# Patient Record
Sex: Male | Born: 1945
Health system: Southern US, Community
[De-identification: ages and names within clinical notes are randomized; demographics above are authoritative.]

---

## 2003-06-29 ENCOUNTER — Ambulatory Visit (HOSPITAL_COMMUNITY): Admission: RE | Admit: 2003-06-29 | Discharge: 2003-06-29 | Payer: Self-pay | Admitting: Gastroenterology

## 2003-09-13 ENCOUNTER — Ambulatory Visit (HOSPITAL_COMMUNITY): Admission: RE | Admit: 2003-09-13 | Discharge: 2003-09-13 | Payer: Self-pay | Admitting: Gastroenterology

## 2015-01-11 DIAGNOSIS — E669 Obesity, unspecified: Secondary | ICD-10-CM | POA: Diagnosis present

## 2015-01-11 DIAGNOSIS — Z87891 Personal history of nicotine dependence: Secondary | ICD-10-CM | POA: Diagnosis not present

## 2015-01-11 DIAGNOSIS — L02415 Cutaneous abscess of right lower limb: Secondary | ICD-10-CM | POA: Diagnosis present

## 2015-01-11 DIAGNOSIS — Z8614 Personal history of Methicillin resistant Staphylococcus aureus infection: Secondary | ICD-10-CM | POA: Diagnosis not present

## 2015-01-11 DIAGNOSIS — L03115 Cellulitis of right lower limb: Secondary | ICD-10-CM | POA: Diagnosis present

## 2015-01-11 DIAGNOSIS — Z6834 Body mass index (BMI) 34.0-34.9, adult: Secondary | ICD-10-CM | POA: Diagnosis not present

## 2015-09-08 DIAGNOSIS — Z08 Encounter for follow-up examination after completed treatment for malignant neoplasm: Secondary | ICD-10-CM | POA: Diagnosis not present

## 2015-09-08 DIAGNOSIS — D225 Melanocytic nevi of trunk: Secondary | ICD-10-CM | POA: Diagnosis not present

## 2015-09-08 DIAGNOSIS — L821 Other seborrheic keratosis: Secondary | ICD-10-CM | POA: Diagnosis not present

## 2015-09-08 DIAGNOSIS — Z85828 Personal history of other malignant neoplasm of skin: Secondary | ICD-10-CM | POA: Diagnosis not present

## 2015-10-11 DIAGNOSIS — I8311 Varicose veins of right lower extremity with inflammation: Secondary | ICD-10-CM | POA: Diagnosis not present

## 2015-10-11 DIAGNOSIS — I83893 Varicose veins of bilateral lower extremities with other complications: Secondary | ICD-10-CM | POA: Diagnosis not present

## 2015-10-11 DIAGNOSIS — I8312 Varicose veins of left lower extremity with inflammation: Secondary | ICD-10-CM | POA: Diagnosis not present

## 2015-10-19 DIAGNOSIS — I83893 Varicose veins of bilateral lower extremities with other complications: Secondary | ICD-10-CM | POA: Diagnosis not present

## 2015-10-19 DIAGNOSIS — I8312 Varicose veins of left lower extremity with inflammation: Secondary | ICD-10-CM | POA: Diagnosis not present

## 2015-10-19 DIAGNOSIS — I8311 Varicose veins of right lower extremity with inflammation: Secondary | ICD-10-CM | POA: Diagnosis not present

## 2015-10-27 DIAGNOSIS — I83893 Varicose veins of bilateral lower extremities with other complications: Secondary | ICD-10-CM | POA: Diagnosis not present

## 2016-05-29 ENCOUNTER — Other Ambulatory Visit: Payer: Self-pay | Admitting: Family Medicine

## 2016-05-29 DIAGNOSIS — Z136 Encounter for screening for cardiovascular disorders: Secondary | ICD-10-CM

## 2016-06-19 ENCOUNTER — Ambulatory Visit
Admission: RE | Admit: 2016-06-19 | Discharge: 2016-06-19 | Disposition: A | Discharge status: Home / Self Care | Payer: Medicare Other | Source: Ambulatory Visit | Attending: Family Medicine | Admitting: Family Medicine

## 2016-06-19 DIAGNOSIS — Z136 Encounter for screening for cardiovascular disorders: Secondary | ICD-10-CM | POA: Diagnosis not present

## 2016-06-21 DIAGNOSIS — E7800 Pure hypercholesterolemia, unspecified: Secondary | ICD-10-CM | POA: Diagnosis not present

## 2016-06-21 DIAGNOSIS — I4510 Unspecified right bundle-branch block: Secondary | ICD-10-CM | POA: Diagnosis not present

## 2016-06-21 DIAGNOSIS — Z87891 Personal history of nicotine dependence: Secondary | ICD-10-CM | POA: Diagnosis not present

## 2016-06-21 DIAGNOSIS — I77811 Abdominal aortic ectasia: Secondary | ICD-10-CM | POA: Diagnosis not present

## 2016-08-24 DIAGNOSIS — I4510 Unspecified right bundle-branch block: Secondary | ICD-10-CM | POA: Diagnosis not present

## 2016-12-17 DIAGNOSIS — R109 Unspecified abdominal pain: Secondary | ICD-10-CM | POA: Diagnosis not present

## 2016-12-17 DIAGNOSIS — R350 Frequency of micturition: Secondary | ICD-10-CM | POA: Diagnosis not present

## 2017-06-13 DIAGNOSIS — Z23 Encounter for immunization: Secondary | ICD-10-CM | POA: Diagnosis not present

## 2017-10-11 DIAGNOSIS — K6289 Other specified diseases of anus and rectum: Secondary | ICD-10-CM | POA: Diagnosis not present

## 2017-10-11 DIAGNOSIS — D126 Benign neoplasm of colon, unspecified: Secondary | ICD-10-CM | POA: Diagnosis not present

## 2017-10-11 DIAGNOSIS — Z1211 Encounter for screening for malignant neoplasm of colon: Secondary | ICD-10-CM | POA: Diagnosis not present

## 2017-10-11 DIAGNOSIS — K5730 Diverticulosis of large intestine without perforation or abscess without bleeding: Secondary | ICD-10-CM | POA: Diagnosis not present

## 2017-10-11 DIAGNOSIS — K648 Other hemorrhoids: Secondary | ICD-10-CM | POA: Diagnosis not present

## 2017-10-11 DIAGNOSIS — Z800 Family history of malignant neoplasm of digestive organs: Secondary | ICD-10-CM | POA: Diagnosis not present

## 2017-10-15 DIAGNOSIS — Z1211 Encounter for screening for malignant neoplasm of colon: Secondary | ICD-10-CM | POA: Diagnosis not present

## 2017-10-15 DIAGNOSIS — D126 Benign neoplasm of colon, unspecified: Secondary | ICD-10-CM | POA: Diagnosis not present

## 2018-02-27 DIAGNOSIS — S70369A Insect bite (nonvenomous), unspecified thigh, initial encounter: Secondary | ICD-10-CM | POA: Diagnosis not present

## 2018-02-27 DIAGNOSIS — W57XXXA Bitten or stung by nonvenomous insect and other nonvenomous arthropods, initial encounter: Secondary | ICD-10-CM | POA: Diagnosis not present

## 2018-03-26 DIAGNOSIS — A770 Spotted fever due to Rickettsia rickettsii: Secondary | ICD-10-CM | POA: Diagnosis not present

## 2018-06-04 DIAGNOSIS — Z23 Encounter for immunization: Secondary | ICD-10-CM | POA: Diagnosis not present

## 2018-09-15 DIAGNOSIS — H2589 Other age-related cataract: Secondary | ICD-10-CM | POA: Diagnosis not present

## 2018-09-29 DIAGNOSIS — Z0000 Encounter for general adult medical examination without abnormal findings: Secondary | ICD-10-CM | POA: Diagnosis not present

## 2018-09-29 DIAGNOSIS — E7800 Pure hypercholesterolemia, unspecified: Secondary | ICD-10-CM | POA: Diagnosis not present

## 2018-09-29 DIAGNOSIS — Z125 Encounter for screening for malignant neoplasm of prostate: Secondary | ICD-10-CM | POA: Diagnosis not present

## 2018-09-29 DIAGNOSIS — C44219 Basal cell carcinoma of skin of left ear and external auricular canal: Secondary | ICD-10-CM | POA: Diagnosis not present

## 2018-09-30 DIAGNOSIS — E7800 Pure hypercholesterolemia, unspecified: Secondary | ICD-10-CM | POA: Diagnosis not present

## 2018-09-30 DIAGNOSIS — Z0000 Encounter for general adult medical examination without abnormal findings: Secondary | ICD-10-CM | POA: Diagnosis not present

## 2018-09-30 DIAGNOSIS — C44219 Basal cell carcinoma of skin of left ear and external auricular canal: Secondary | ICD-10-CM | POA: Diagnosis not present

## 2018-09-30 DIAGNOSIS — Z125 Encounter for screening for malignant neoplasm of prostate: Secondary | ICD-10-CM | POA: Diagnosis not present

## 2018-10-16 DIAGNOSIS — C44219 Basal cell carcinoma of skin of left ear and external auricular canal: Secondary | ICD-10-CM | POA: Diagnosis not present

## 2019-02-18 DIAGNOSIS — J029 Acute pharyngitis, unspecified: Secondary | ICD-10-CM | POA: Diagnosis not present

## 2019-02-19 DIAGNOSIS — J029 Acute pharyngitis, unspecified: Secondary | ICD-10-CM | POA: Diagnosis not present

## 2019-04-15 DIAGNOSIS — C44219 Basal cell carcinoma of skin of left ear and external auricular canal: Secondary | ICD-10-CM | POA: Diagnosis not present

## 2019-09-21 ENCOUNTER — Ambulatory Visit: Payer: Medicare Other | Attending: Internal Medicine

## 2019-09-21 DIAGNOSIS — Z23 Encounter for immunization: Secondary | ICD-10-CM

## 2019-09-21 NOTE — Progress Notes (Signed)
   Covid-19 Vaccination Clinic  Name:  Adam Giles    MRN: [id number] DOB: Sep 22, 1945  09/21/2019  [name] was observed post Covid-19 immunization for 15 minutes without incidence. He was provided with Vaccine Information Sheet and instruction to access the V-Safe system.   [name] was instructed to call 911 with any severe reactions post vaccine: Marland Kitchen Difficulty breathing  . Swelling of your face and throat  . A fast heartbeat  . A bad rash all over your body  . Dizziness and weakness    Immunizations Administered    Name Date Dose VIS Date Route   Pfizer COVID-19 Vaccine 09/21/2019  3:52 PM 0.3 mL 07/24/2019 Intramuscular   Manufacturer: [place]   Lot: [id number]   [place]: [id number]

## 2019-10-14 DIAGNOSIS — E7800 Pure hypercholesterolemia, unspecified: Secondary | ICD-10-CM | POA: Diagnosis not present

## 2019-10-14 DIAGNOSIS — Z0000 Encounter for general adult medical examination without abnormal findings: Secondary | ICD-10-CM | POA: Diagnosis not present

## 2019-10-14 DIAGNOSIS — Z125 Encounter for screening for malignant neoplasm of prostate: Secondary | ICD-10-CM | POA: Diagnosis not present

## 2019-10-14 DIAGNOSIS — C4491 Basal cell carcinoma of skin, unspecified: Secondary | ICD-10-CM | POA: Diagnosis not present

## 2019-10-16 ENCOUNTER — Ambulatory Visit: Payer: Medicare Other | Attending: Internal Medicine

## 2019-10-16 DIAGNOSIS — Z23 Encounter for immunization: Secondary | ICD-10-CM

## 2019-10-16 NOTE — Progress Notes (Signed)
   Covid-19 Vaccination Clinic  Name:  Adam Giles    MRN: [id number] DOB: January 24, 1946  10/16/2019  Adam Giles was observed post Covid-19 immunization for 15 minutes without incident. He was provided with Vaccine Information Sheet and instruction to access the V-Safe system.   Adam Giles was instructed to call 911 with any severe reactions post vaccine: Marland Kitchen Difficulty breathing  . Swelling of face and throat  . A fast heartbeat  . A bad rash all over body  . Dizziness and weakness   Immunizations Administered    Name Date Dose VIS Date Route   Pfizer COVID-19 Vaccine 10/16/2019 12:26 PM 0.3 mL 07/24/2019 Intramuscular   Manufacturer: [place]   Lot: [id number]   [place]: [id number]

## 2019-11-24 DIAGNOSIS — C44219 Basal cell carcinoma of skin of left ear and external auricular canal: Secondary | ICD-10-CM | POA: Diagnosis not present

## 2019-12-01 DIAGNOSIS — H35371 Puckering of macula, right eye: Secondary | ICD-10-CM | POA: Diagnosis not present

## 2019-12-01 DIAGNOSIS — H25812 Combined forms of age-related cataract, left eye: Secondary | ICD-10-CM | POA: Diagnosis not present

## 2019-12-01 DIAGNOSIS — H02831 Dermatochalasis of right upper eyelid: Secondary | ICD-10-CM | POA: Diagnosis not present

## 2019-12-01 DIAGNOSIS — H25811 Combined forms of age-related cataract, right eye: Secondary | ICD-10-CM | POA: Diagnosis not present

## 2019-12-01 DIAGNOSIS — Z01818 Encounter for other preprocedural examination: Secondary | ICD-10-CM | POA: Diagnosis not present

## 2019-12-17 DIAGNOSIS — H2511 Age-related nuclear cataract, right eye: Secondary | ICD-10-CM | POA: Diagnosis not present

## 2019-12-17 DIAGNOSIS — H25811 Combined forms of age-related cataract, right eye: Secondary | ICD-10-CM | POA: Diagnosis not present

## 2019-12-31 DIAGNOSIS — H25812 Combined forms of age-related cataract, left eye: Secondary | ICD-10-CM | POA: Diagnosis not present

## 2019-12-31 DIAGNOSIS — H2512 Age-related nuclear cataract, left eye: Secondary | ICD-10-CM | POA: Diagnosis not present

## 2020-03-16 DIAGNOSIS — E7800 Pure hypercholesterolemia, unspecified: Secondary | ICD-10-CM | POA: Diagnosis not present

## 2020-03-16 DIAGNOSIS — I10 Essential (primary) hypertension: Secondary | ICD-10-CM | POA: Diagnosis not present

## 2020-04-06 DIAGNOSIS — I10 Essential (primary) hypertension: Secondary | ICD-10-CM | POA: Diagnosis not present

## 2020-05-18 DIAGNOSIS — Z23 Encounter for immunization: Secondary | ICD-10-CM | POA: Diagnosis not present

## 2020-06-29 DIAGNOSIS — Z23 Encounter for immunization: Secondary | ICD-10-CM | POA: Diagnosis not present

## 2020-07-18 DIAGNOSIS — I10 Essential (primary) hypertension: Secondary | ICD-10-CM | POA: Diagnosis not present

## 2020-07-18 DIAGNOSIS — E7800 Pure hypercholesterolemia, unspecified: Secondary | ICD-10-CM | POA: Diagnosis not present

## 2020-10-18 DIAGNOSIS — Z0000 Encounter for general adult medical examination without abnormal findings: Secondary | ICD-10-CM | POA: Diagnosis not present

## 2020-10-18 DIAGNOSIS — H6192 Disorder of left external ear, unspecified: Secondary | ICD-10-CM | POA: Diagnosis not present

## 2020-10-18 DIAGNOSIS — I77811 Abdominal aortic ectasia: Secondary | ICD-10-CM | POA: Diagnosis not present

## 2020-10-18 DIAGNOSIS — I10 Essential (primary) hypertension: Secondary | ICD-10-CM | POA: Diagnosis not present

## 2020-10-18 DIAGNOSIS — Z23 Encounter for immunization: Secondary | ICD-10-CM | POA: Diagnosis not present

## 2020-10-18 DIAGNOSIS — Z87448 Personal history of other diseases of urinary system: Secondary | ICD-10-CM | POA: Diagnosis not present

## 2020-10-18 DIAGNOSIS — Z125 Encounter for screening for malignant neoplasm of prostate: Secondary | ICD-10-CM | POA: Diagnosis not present

## 2020-10-18 DIAGNOSIS — E7800 Pure hypercholesterolemia, unspecified: Secondary | ICD-10-CM | POA: Diagnosis not present

## 2020-10-18 DIAGNOSIS — I868 Varicose veins of other specified sites: Secondary | ICD-10-CM | POA: Diagnosis not present

## 2020-10-19 ENCOUNTER — Other Ambulatory Visit: Payer: Self-pay | Admitting: Radiology

## 2020-10-19 DIAGNOSIS — I77811 Abdominal aortic ectasia: Secondary | ICD-10-CM

## 2020-11-01 DIAGNOSIS — L821 Other seborrheic keratosis: Secondary | ICD-10-CM | POA: Diagnosis not present

## 2020-11-01 DIAGNOSIS — L570 Actinic keratosis: Secondary | ICD-10-CM | POA: Diagnosis not present

## 2020-11-01 DIAGNOSIS — X32XXXD Exposure to sunlight, subsequent encounter: Secondary | ICD-10-CM | POA: Diagnosis not present

## 2020-11-01 DIAGNOSIS — D225 Melanocytic nevi of trunk: Secondary | ICD-10-CM | POA: Diagnosis not present

## 2020-11-03 ENCOUNTER — Other Ambulatory Visit: Payer: Self-pay | Admitting: Family Medicine

## 2020-11-03 ENCOUNTER — Ambulatory Visit
Admission: RE | Admit: 2020-11-03 | Discharge: 2020-11-03 | Disposition: A | Discharge status: Home / Self Care | Payer: Medicare HMO | Source: Ambulatory Visit | Attending: Radiology | Admitting: Radiology

## 2020-11-03 DIAGNOSIS — I77811 Abdominal aortic ectasia: Secondary | ICD-10-CM

## 2020-11-03 DIAGNOSIS — Z0389 Encounter for observation for other suspected diseases and conditions ruled out: Secondary | ICD-10-CM | POA: Diagnosis not present

## 2021-03-01 DIAGNOSIS — K029 Dental caries, unspecified: Secondary | ICD-10-CM | POA: Diagnosis not present

## 2021-03-01 DIAGNOSIS — R5383 Other fatigue: Secondary | ICD-10-CM | POA: Diagnosis not present

## 2021-03-01 DIAGNOSIS — R110 Nausea: Secondary | ICD-10-CM | POA: Diagnosis not present

## 2021-03-01 DIAGNOSIS — L089 Local infection of the skin and subcutaneous tissue, unspecified: Secondary | ICD-10-CM | POA: Diagnosis not present

## 2021-03-01 DIAGNOSIS — R519 Headache, unspecified: Secondary | ICD-10-CM | POA: Diagnosis not present

## 2021-03-10 DIAGNOSIS — R5383 Other fatigue: Secondary | ICD-10-CM | POA: Diagnosis not present

## 2021-03-10 DIAGNOSIS — R42 Dizziness and giddiness: Secondary | ICD-10-CM | POA: Diagnosis not present

## 2021-03-10 DIAGNOSIS — Z6830 Body mass index (BMI) 30.0-30.9, adult: Secondary | ICD-10-CM | POA: Diagnosis not present

## 2021-03-10 DIAGNOSIS — R519 Headache, unspecified: Secondary | ICD-10-CM | POA: Diagnosis not present

## 2021-04-18 DIAGNOSIS — L218 Other seborrheic dermatitis: Secondary | ICD-10-CM | POA: Diagnosis not present

## 2021-04-18 DIAGNOSIS — Z08 Encounter for follow-up examination after completed treatment for malignant neoplasm: Secondary | ICD-10-CM | POA: Diagnosis not present

## 2021-04-18 DIAGNOSIS — L570 Actinic keratosis: Secondary | ICD-10-CM | POA: Diagnosis not present

## 2021-04-18 DIAGNOSIS — X32XXXD Exposure to sunlight, subsequent encounter: Secondary | ICD-10-CM | POA: Diagnosis not present

## 2021-04-18 DIAGNOSIS — Z85828 Personal history of other malignant neoplasm of skin: Secondary | ICD-10-CM | POA: Diagnosis not present

## 2021-04-26 DIAGNOSIS — E7800 Pure hypercholesterolemia, unspecified: Secondary | ICD-10-CM | POA: Diagnosis not present

## 2021-04-26 DIAGNOSIS — I10 Essential (primary) hypertension: Secondary | ICD-10-CM | POA: Diagnosis not present

## 2021-04-26 DIAGNOSIS — Z1211 Encounter for screening for malignant neoplasm of colon: Secondary | ICD-10-CM | POA: Diagnosis not present

## 2021-04-28 DIAGNOSIS — Z1211 Encounter for screening for malignant neoplasm of colon: Secondary | ICD-10-CM | POA: Diagnosis not present

## 2021-11-13 DIAGNOSIS — Z125 Encounter for screening for malignant neoplasm of prostate: Secondary | ICD-10-CM | POA: Diagnosis not present

## 2021-11-13 DIAGNOSIS — Z0000 Encounter for general adult medical examination without abnormal findings: Secondary | ICD-10-CM | POA: Diagnosis not present

## 2021-11-13 DIAGNOSIS — I10 Essential (primary) hypertension: Secondary | ICD-10-CM | POA: Diagnosis not present

## 2021-11-13 DIAGNOSIS — I77811 Abdominal aortic ectasia: Secondary | ICD-10-CM | POA: Diagnosis not present

## 2021-11-13 DIAGNOSIS — Z23 Encounter for immunization: Secondary | ICD-10-CM | POA: Diagnosis not present

## 2021-11-13 DIAGNOSIS — E7800 Pure hypercholesterolemia, unspecified: Secondary | ICD-10-CM | POA: Diagnosis not present

## 2021-12-12 DIAGNOSIS — L570 Actinic keratosis: Secondary | ICD-10-CM | POA: Diagnosis not present

## 2021-12-12 DIAGNOSIS — Z85828 Personal history of other malignant neoplasm of skin: Secondary | ICD-10-CM | POA: Diagnosis not present

## 2021-12-12 DIAGNOSIS — L638 Other alopecia areata: Secondary | ICD-10-CM | POA: Diagnosis not present

## 2021-12-12 DIAGNOSIS — D225 Melanocytic nevi of trunk: Secondary | ICD-10-CM | POA: Diagnosis not present

## 2021-12-12 DIAGNOSIS — X32XXXD Exposure to sunlight, subsequent encounter: Secondary | ICD-10-CM | POA: Diagnosis not present

## 2021-12-12 DIAGNOSIS — Z08 Encounter for follow-up examination after completed treatment for malignant neoplasm: Secondary | ICD-10-CM | POA: Diagnosis not present

## 2022-03-30 DIAGNOSIS — Z08 Encounter for follow-up examination after completed treatment for malignant neoplasm: Secondary | ICD-10-CM | POA: Diagnosis not present

## 2022-03-30 DIAGNOSIS — L820 Inflamed seborrheic keratosis: Secondary | ICD-10-CM | POA: Diagnosis not present

## 2022-03-30 DIAGNOSIS — X32XXXD Exposure to sunlight, subsequent encounter: Secondary | ICD-10-CM | POA: Diagnosis not present

## 2022-03-30 DIAGNOSIS — L570 Actinic keratosis: Secondary | ICD-10-CM | POA: Diagnosis not present

## 2022-03-30 DIAGNOSIS — Z85828 Personal history of other malignant neoplasm of skin: Secondary | ICD-10-CM | POA: Diagnosis not present

## 2022-06-06 DIAGNOSIS — R519 Headache, unspecified: Secondary | ICD-10-CM | POA: Diagnosis not present

## 2022-06-06 DIAGNOSIS — Z03818 Encounter for observation for suspected exposure to other biological agents ruled out: Secondary | ICD-10-CM | POA: Diagnosis not present

## 2022-06-06 DIAGNOSIS — R5383 Other fatigue: Secondary | ICD-10-CM | POA: Diagnosis not present

## 2022-06-06 DIAGNOSIS — Z6830 Body mass index (BMI) 30.0-30.9, adult: Secondary | ICD-10-CM | POA: Diagnosis not present

## 2022-08-26 IMAGING — US US AORTA
1 series · 14 of 25 positions shown · non-contrast
Comparison: 06/19/2016

CLINICAL DATA: Follow-up ectatic abdominal aorta.

EXAM:
ULTRASOUND OF ABDOMINAL AORTA
TECHNIQUE: Ultrasound examination of the abdominal aorta and proximal common
iliac arteries was performed to evaluate for aneurysm. Additional
color and Doppler images of the distal aorta were obtained to
document patency.

[Series 1: us aorta · 0.23mm/px · 14 of 30 slices shown]
[im 1/30]
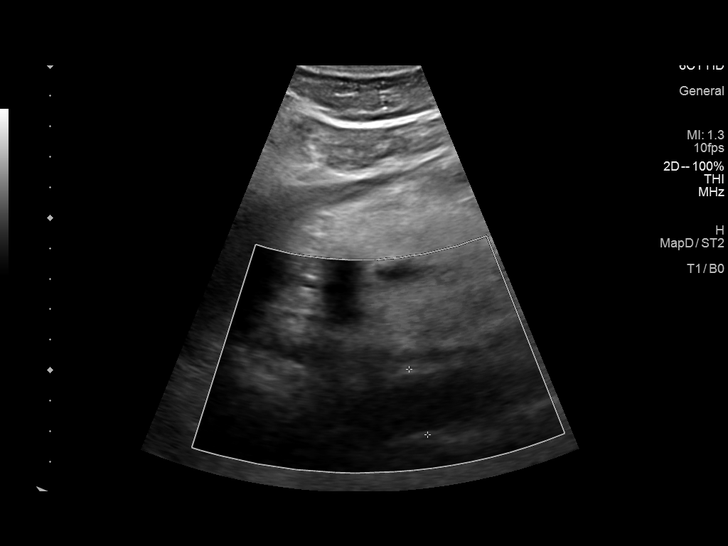
[im 3/30]
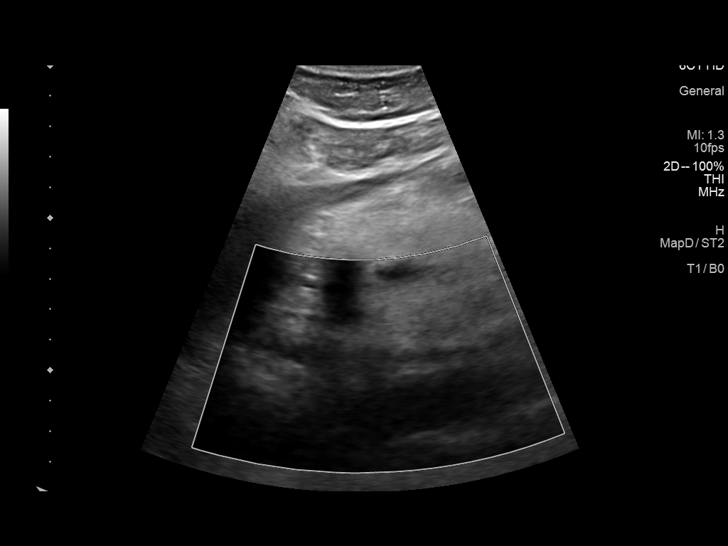
[im 5/30]
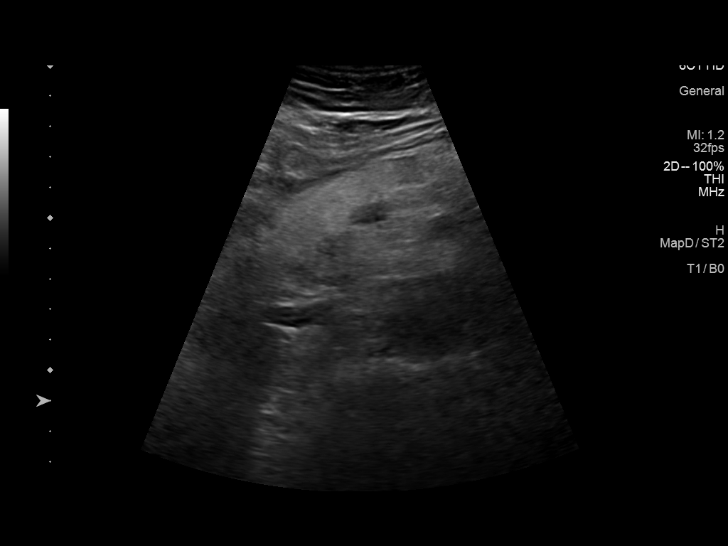
[im 8/30]
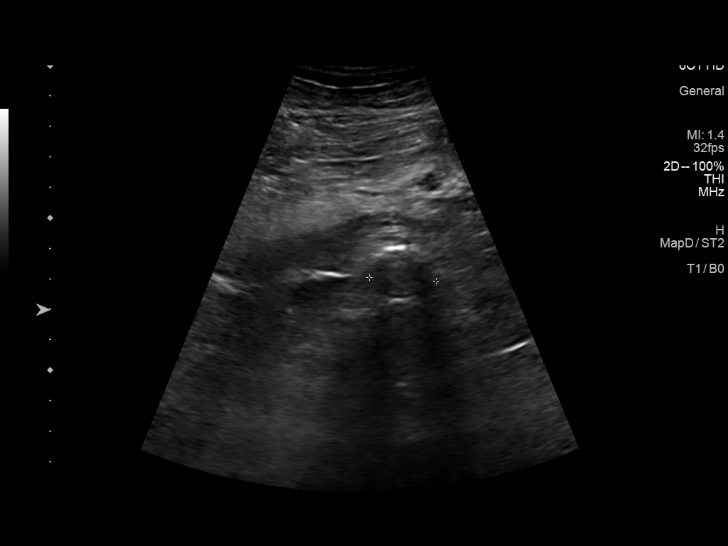
[im 10/30]
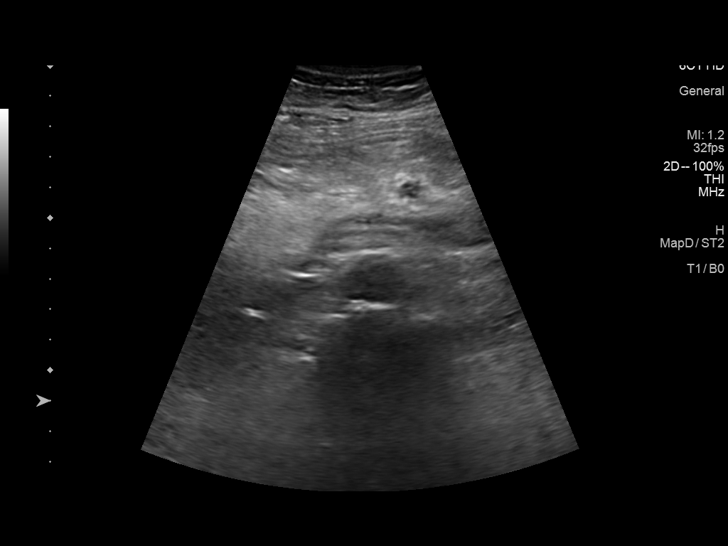
[im 11/30]
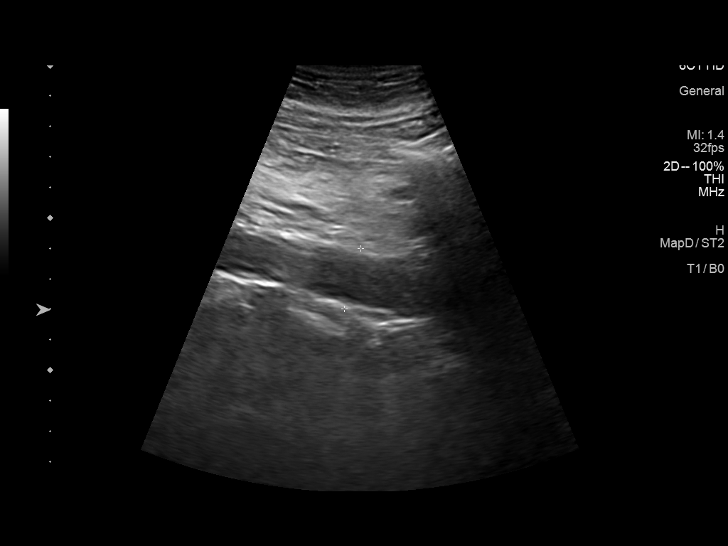
[im 14/30]
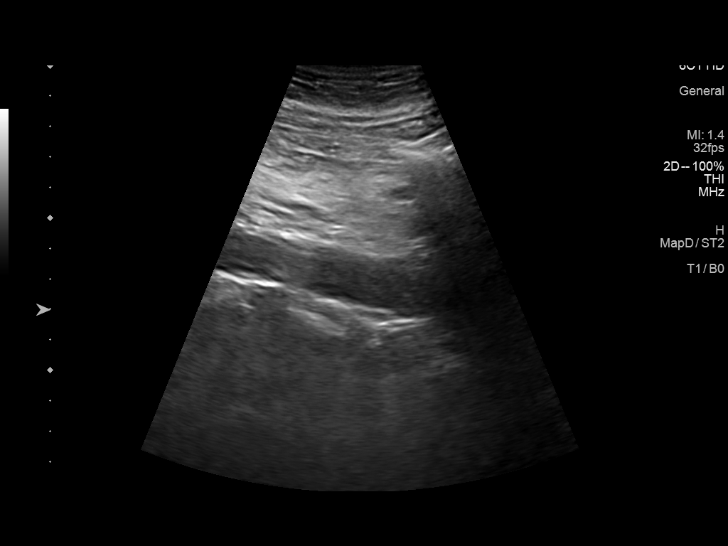
[im 16/30]
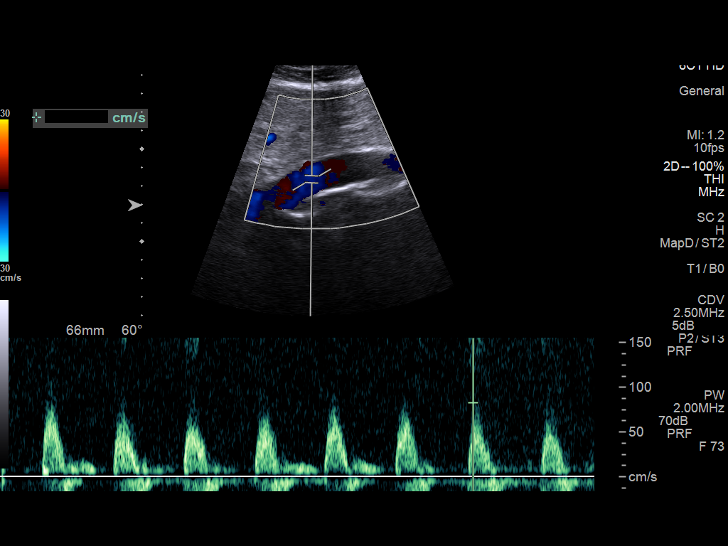
[im 19/30]
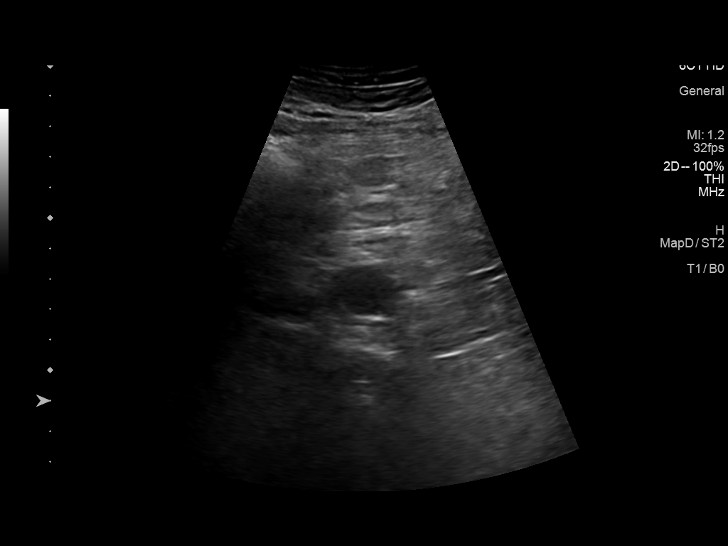
[im 20/30]
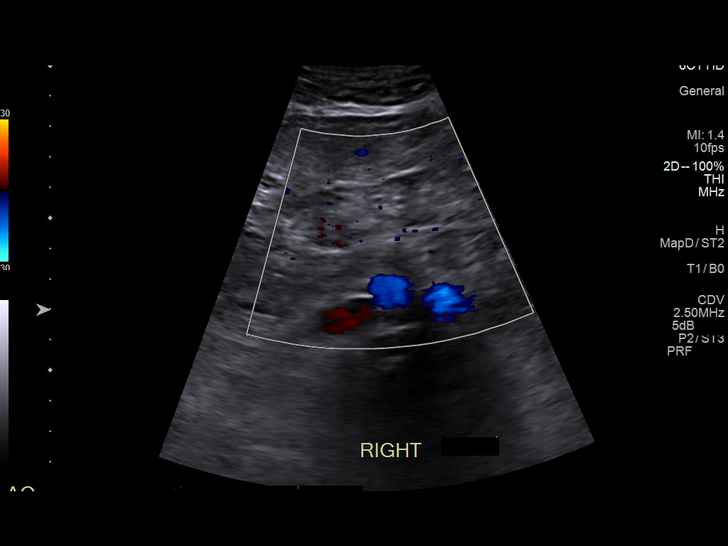
[im 22/30]
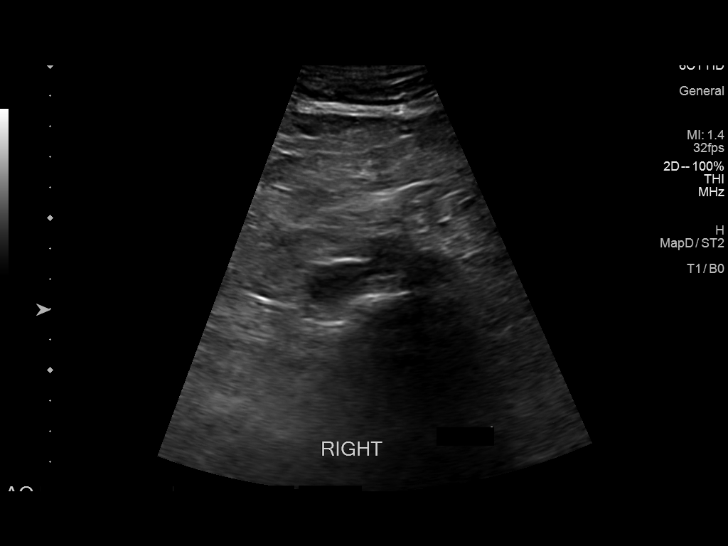
[im 25/30]
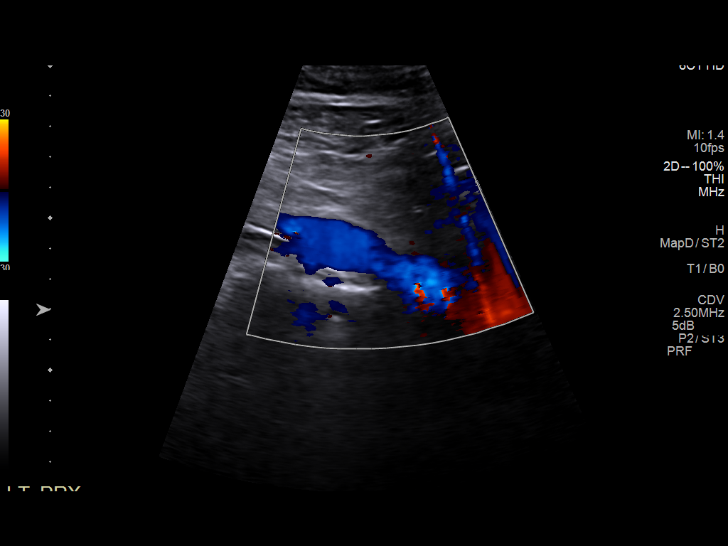
[im 27/30]
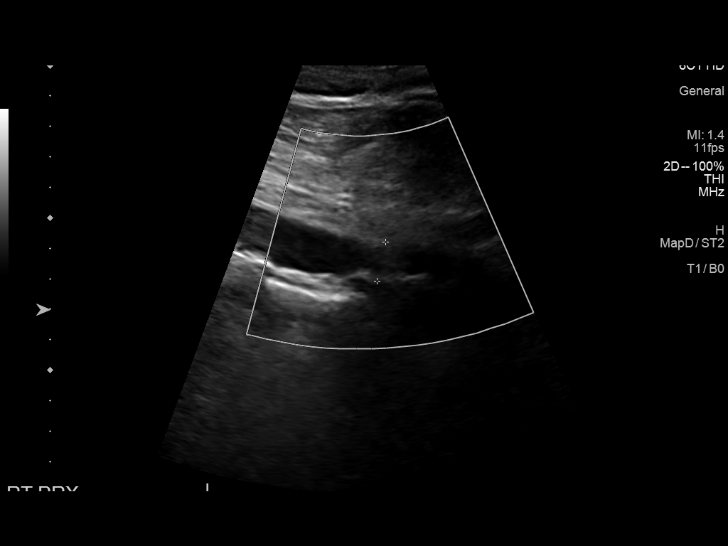
[im 30/30]
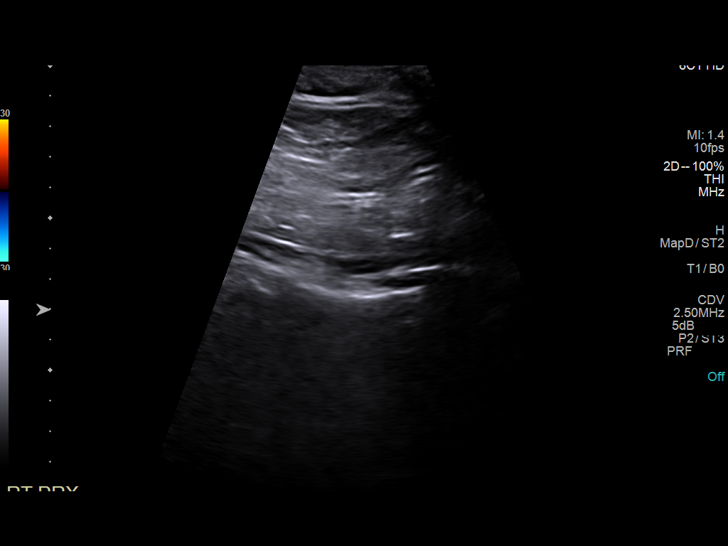

[14 of 25 positions shown; findings below may reference images not displayed]

FINDINGS: Abdominal aortic measurements as follows:

Proximal:  2.2 cm

Mid:  2.1 cm

Distal:  2.1 cm
Patent: Yes, peak systolic velocity is 83 cm/s

Right common iliac artery: 1.3 cm

Left common iliac artery: 1.2 cm
IMPRESSION: No evidence of abdominal aortic aneurysm.

## 2022-11-15 DIAGNOSIS — Z86010 Personal history of colonic polyps: Secondary | ICD-10-CM | POA: Diagnosis not present

## 2022-11-15 DIAGNOSIS — Z125 Encounter for screening for malignant neoplasm of prostate: Secondary | ICD-10-CM | POA: Diagnosis not present

## 2022-11-15 DIAGNOSIS — Z0000 Encounter for general adult medical examination without abnormal findings: Secondary | ICD-10-CM | POA: Diagnosis not present

## 2022-11-15 DIAGNOSIS — I10 Essential (primary) hypertension: Secondary | ICD-10-CM | POA: Diagnosis not present

## 2022-11-15 DIAGNOSIS — Z1211 Encounter for screening for malignant neoplasm of colon: Secondary | ICD-10-CM | POA: Diagnosis not present

## 2022-11-15 DIAGNOSIS — E7800 Pure hypercholesterolemia, unspecified: Secondary | ICD-10-CM | POA: Diagnosis not present

## 2022-11-15 DIAGNOSIS — E6609 Other obesity due to excess calories: Secondary | ICD-10-CM | POA: Diagnosis not present

## 2022-11-15 DIAGNOSIS — Z6831 Body mass index (BMI) 31.0-31.9, adult: Secondary | ICD-10-CM | POA: Diagnosis not present

## 2022-11-15 DIAGNOSIS — R519 Headache, unspecified: Secondary | ICD-10-CM | POA: Diagnosis not present

## 2022-11-15 DIAGNOSIS — R5383 Other fatigue: Secondary | ICD-10-CM | POA: Diagnosis not present

## 2023-03-29 DIAGNOSIS — Z85828 Personal history of other malignant neoplasm of skin: Secondary | ICD-10-CM | POA: Diagnosis not present

## 2023-03-29 DIAGNOSIS — Z08 Encounter for follow-up examination after completed treatment for malignant neoplasm: Secondary | ICD-10-CM | POA: Diagnosis not present

## 2023-03-29 DIAGNOSIS — X32XXXD Exposure to sunlight, subsequent encounter: Secondary | ICD-10-CM | POA: Diagnosis not present

## 2023-03-29 DIAGNOSIS — L570 Actinic keratosis: Secondary | ICD-10-CM | POA: Diagnosis not present

## 2023-05-22 DIAGNOSIS — F1021 Alcohol dependence, in remission: Secondary | ICD-10-CM | POA: Diagnosis not present

## 2023-05-22 DIAGNOSIS — I10 Essential (primary) hypertension: Secondary | ICD-10-CM | POA: Diagnosis not present

## 2023-05-22 DIAGNOSIS — I77811 Abdominal aortic ectasia: Secondary | ICD-10-CM | POA: Diagnosis not present

## 2023-05-22 DIAGNOSIS — R7301 Impaired fasting glucose: Secondary | ICD-10-CM | POA: Diagnosis not present

## 2023-05-22 DIAGNOSIS — K5730 Diverticulosis of large intestine without perforation or abscess without bleeding: Secondary | ICD-10-CM | POA: Diagnosis not present

## 2023-05-22 DIAGNOSIS — M189 Osteoarthritis of first carpometacarpal joint, unspecified: Secondary | ICD-10-CM | POA: Diagnosis not present

## 2023-05-22 DIAGNOSIS — Z6832 Body mass index (BMI) 32.0-32.9, adult: Secondary | ICD-10-CM | POA: Diagnosis not present

## 2023-06-18 DIAGNOSIS — K5730 Diverticulosis of large intestine without perforation or abscess without bleeding: Secondary | ICD-10-CM | POA: Diagnosis not present

## 2023-06-18 DIAGNOSIS — Z860100 Personal history of colon polyps, unspecified: Secondary | ICD-10-CM | POA: Diagnosis not present

## 2023-06-18 DIAGNOSIS — Z09 Encounter for follow-up examination after completed treatment for conditions other than malignant neoplasm: Secondary | ICD-10-CM | POA: Diagnosis not present

## 2023-06-18 DIAGNOSIS — Z800 Family history of malignant neoplasm of digestive organs: Secondary | ICD-10-CM | POA: Diagnosis not present

## 2023-06-18 DIAGNOSIS — D122 Benign neoplasm of ascending colon: Secondary | ICD-10-CM | POA: Diagnosis not present

## 2023-06-18 DIAGNOSIS — K648 Other hemorrhoids: Secondary | ICD-10-CM | POA: Diagnosis not present

## 2023-06-20 DIAGNOSIS — D122 Benign neoplasm of ascending colon: Secondary | ICD-10-CM | POA: Diagnosis not present

## 2024-01-30 DIAGNOSIS — R5383 Other fatigue: Secondary | ICD-10-CM | POA: Diagnosis not present

## 2024-01-30 DIAGNOSIS — R0689 Other abnormalities of breathing: Secondary | ICD-10-CM | POA: Diagnosis not present

## 2024-01-31 ENCOUNTER — Ambulatory Visit (HOSPITAL_COMMUNITY)
Admission: RE | Admit: 2024-01-31 | Discharge: 2024-01-31 | Disposition: A | Discharge status: Home / Self Care | Source: Ambulatory Visit | Attending: Family Medicine | Admitting: Family Medicine

## 2024-01-31 ENCOUNTER — Other Ambulatory Visit (HOSPITAL_COMMUNITY): Payer: Self-pay | Admitting: Family Medicine

## 2024-01-31 DIAGNOSIS — J90 Pleural effusion, not elsewhere classified: Secondary | ICD-10-CM | POA: Insufficient documentation

## 2024-01-31 DIAGNOSIS — J9811 Atelectasis: Secondary | ICD-10-CM | POA: Diagnosis not present

## 2024-01-31 MED ORDER — SODIUM CHLORIDE (PF) 0.9 % IJ SOLN
INTRAMUSCULAR | Status: AC
  Filled 2024-01-31: qty 50

## 2024-01-31 MED ORDER — IOHEXOL 300 MG/ML  SOLN
75.0000 mL | Freq: Once | INTRAMUSCULAR | Status: AC | PRN
  Administered 2024-01-31: 75 mL via INTRAVENOUS

## 2024-02-03 DIAGNOSIS — R932 Abnormal findings on diagnostic imaging of liver and biliary tract: Secondary | ICD-10-CM | POA: Diagnosis not present

## 2024-02-03 DIAGNOSIS — J90 Pleural effusion, not elsewhere classified: Secondary | ICD-10-CM | POA: Diagnosis not present

## 2024-02-03 DIAGNOSIS — R5383 Other fatigue: Secondary | ICD-10-CM | POA: Diagnosis not present

## 2024-02-03 DIAGNOSIS — R0689 Other abnormalities of breathing: Secondary | ICD-10-CM | POA: Diagnosis not present

## 2024-02-04 ENCOUNTER — Other Ambulatory Visit (HOSPITAL_COMMUNITY): Payer: Self-pay | Admitting: Family Medicine

## 2024-02-04 DIAGNOSIS — J90 Pleural effusion, not elsewhere classified: Secondary | ICD-10-CM | POA: Diagnosis not present

## 2024-02-04 DIAGNOSIS — Z0000 Encounter for general adult medical examination without abnormal findings: Secondary | ICD-10-CM | POA: Diagnosis not present

## 2024-02-04 DIAGNOSIS — R9389 Abnormal findings on diagnostic imaging of other specified body structures: Secondary | ICD-10-CM

## 2024-02-04 DIAGNOSIS — R7303 Prediabetes: Secondary | ICD-10-CM | POA: Diagnosis not present

## 2024-02-04 DIAGNOSIS — R140 Abdominal distension (gaseous): Secondary | ICD-10-CM

## 2024-02-04 DIAGNOSIS — I10 Essential (primary) hypertension: Secondary | ICD-10-CM | POA: Diagnosis not present

## 2024-02-04 DIAGNOSIS — R935 Abnormal findings on diagnostic imaging of other abdominal regions, including retroperitoneum: Secondary | ICD-10-CM

## 2024-02-04 DIAGNOSIS — E7800 Pure hypercholesterolemia, unspecified: Secondary | ICD-10-CM | POA: Diagnosis not present

## 2024-02-04 DIAGNOSIS — R932 Abnormal findings on diagnostic imaging of liver and biliary tract: Secondary | ICD-10-CM

## 2024-02-04 DIAGNOSIS — Z125 Encounter for screening for malignant neoplasm of prostate: Secondary | ICD-10-CM | POA: Diagnosis not present

## 2024-02-05 ENCOUNTER — Ambulatory Visit (HOSPITAL_COMMUNITY)
Admission: RE | Admit: 2024-02-05 | Discharge: 2024-02-05 | Discharge status: Home / Self Care | Source: Ambulatory Visit | Attending: Family Medicine | Admitting: Family Medicine

## 2024-02-05 ENCOUNTER — Ambulatory Visit (HOSPITAL_COMMUNITY)
Admission: RE | Admit: 2024-02-05 | Discharge: 2024-02-05 | Disposition: A | Discharge status: Home / Self Care | Source: Ambulatory Visit | Attending: Family Medicine | Admitting: Family Medicine

## 2024-02-05 ENCOUNTER — Ambulatory Visit (HOSPITAL_BASED_OUTPATIENT_CLINIC_OR_DEPARTMENT_OTHER)

## 2024-02-05 ENCOUNTER — Encounter (HOSPITAL_BASED_OUTPATIENT_CLINIC_OR_DEPARTMENT_OTHER): Payer: Self-pay

## 2024-02-05 ENCOUNTER — Ambulatory Visit (HOSPITAL_COMMUNITY)
Admission: RE | Admit: 2024-02-05 | Discharge: 2024-02-05 | Disposition: A | Discharge status: Home / Self Care | Source: Ambulatory Visit | Attending: Radiology | Admitting: Radiology

## 2024-02-05 DIAGNOSIS — Z48813 Encounter for surgical aftercare following surgery on the respiratory system: Secondary | ICD-10-CM | POA: Diagnosis not present

## 2024-02-05 DIAGNOSIS — J189 Pneumonia, unspecified organism: Secondary | ICD-10-CM | POA: Diagnosis not present

## 2024-02-05 DIAGNOSIS — R140 Abdominal distension (gaseous): Secondary | ICD-10-CM

## 2024-02-05 DIAGNOSIS — R9389 Abnormal findings on diagnostic imaging of other specified body structures: Secondary | ICD-10-CM

## 2024-02-05 DIAGNOSIS — J9811 Atelectasis: Secondary | ICD-10-CM | POA: Diagnosis not present

## 2024-02-05 DIAGNOSIS — R932 Abnormal findings on diagnostic imaging of liver and biliary tract: Secondary | ICD-10-CM

## 2024-02-05 DIAGNOSIS — R935 Abnormal findings on diagnostic imaging of other abdominal regions, including retroperitoneum: Secondary | ICD-10-CM

## 2024-02-05 DIAGNOSIS — J90 Pleural effusion, not elsewhere classified: Secondary | ICD-10-CM | POA: Insufficient documentation

## 2024-02-05 DIAGNOSIS — K838 Other specified diseases of biliary tract: Secondary | ICD-10-CM | POA: Diagnosis not present

## 2024-02-05 LAB — LACTATE DEHYDROGENASE, PLEURAL OR PERITONEAL FLUID: LD, Fluid: 88 U/L — ABNORMAL HIGH (ref 3–23)

## 2024-02-05 LAB — BODY FLUID CELL COUNT WITH DIFFERENTIAL
Eos, Fluid: 1 %
Lymphs, Fluid: 58 %
Monocyte-Macrophage-Serous Fluid: 10 % — ABNORMAL LOW (ref 50–90)
Neutrophil Count, Fluid: 31 % — ABNORMAL HIGH (ref 0–25)
Total Nucleated Cell Count, Fluid: 1424 uL — ABNORMAL HIGH (ref 0–1000)

## 2024-02-05 LAB — PROTEIN, PLEURAL OR PERITONEAL FLUID: Total protein, fluid: 4.2 g/dL

## 2024-02-05 LAB — GLUCOSE, PLEURAL OR PERITONEAL FLUID: Glucose, Fluid: 123 mg/dL

## 2024-02-05 MED ORDER — LIDOCAINE HCL 1 % IJ SOLN
INTRAMUSCULAR | Status: AC
  Filled 2024-02-05: qty 20

## 2024-02-05 NOTE — Procedures (Signed)
 Ultrasound-guided diagnostic and therapeutic left thoracentesis performed yielding 1.5 liters of slightly hazy, amber fluid. No immediate complications. Follow-up chest x-ray pending. The fluid was sent to the lab for preordered studies. EBL none.

## 2024-02-06 LAB — AMYLASE, PLEURAL OR PERITONEAL FLUID: Amylase, Fluid: 44 U/L

## 2024-02-09 LAB — BODY FLUID CULTURE W GRAM STAIN: Culture: NO GROWTH

## 2024-02-10 LAB — CYTOLOGY - NON PAP

## 2024-02-13 ENCOUNTER — Other Ambulatory Visit (HOSPITAL_COMMUNITY): Payer: Self-pay | Admitting: Family Medicine

## 2024-02-13 DIAGNOSIS — R932 Abnormal findings on diagnostic imaging of liver and biliary tract: Secondary | ICD-10-CM

## 2024-02-15 ENCOUNTER — Ambulatory Visit (HOSPITAL_COMMUNITY)
Admission: RE | Admit: 2024-02-15 | Discharge: 2024-02-15 | Disposition: A | Discharge status: Home / Self Care | Source: Ambulatory Visit | Attending: Family Medicine | Admitting: Family Medicine

## 2024-02-15 DIAGNOSIS — R932 Abnormal findings on diagnostic imaging of liver and biliary tract: Secondary | ICD-10-CM | POA: Diagnosis not present

## 2024-02-15 DIAGNOSIS — K7290 Hepatic failure, unspecified without coma: Secondary | ICD-10-CM | POA: Diagnosis not present

## 2024-02-15 DIAGNOSIS — R1900 Intra-abdominal and pelvic swelling, mass and lump, unspecified site: Secondary | ICD-10-CM | POA: Diagnosis not present

## 2024-02-15 DIAGNOSIS — I700 Atherosclerosis of aorta: Secondary | ICD-10-CM | POA: Diagnosis not present

## 2024-02-15 MED ORDER — GADOBUTROL 1 MMOL/ML IV SOLN
9.0000 mL | Freq: Once | INTRAVENOUS | Status: AC | PRN
  Administered 2024-02-15: 9 mL via INTRAVENOUS

## 2024-03-06 DIAGNOSIS — J90 Pleural effusion, not elsewhere classified: Secondary | ICD-10-CM | POA: Diagnosis not present

## 2024-03-12 DIAGNOSIS — Z6831 Body mass index (BMI) 31.0-31.9, adult: Secondary | ICD-10-CM | POA: Diagnosis not present

## 2024-03-12 DIAGNOSIS — R972 Elevated prostate specific antigen [PSA]: Secondary | ICD-10-CM | POA: Diagnosis not present

## 2024-03-12 DIAGNOSIS — J90 Pleural effusion, not elsewhere classified: Secondary | ICD-10-CM | POA: Diagnosis not present

## 2024-05-25 ENCOUNTER — Ambulatory Visit

## 2024-05-25 VITALS — BP 125/71 | HR 63 | Temp 97.8°F | Ht 70.0 in | Wt 210.8 lb

## 2024-05-25 DIAGNOSIS — J90 Pleural effusion, not elsewhere classified: Secondary | ICD-10-CM

## 2024-05-25 DIAGNOSIS — A6920 Lyme disease, unspecified: Secondary | ICD-10-CM | POA: Diagnosis not present

## 2024-05-25 DIAGNOSIS — M1990 Unspecified osteoarthritis, unspecified site: Secondary | ICD-10-CM | POA: Diagnosis not present

## 2024-05-25 DIAGNOSIS — R899 Unspecified abnormal finding in specimens from other organs, systems and tissues: Secondary | ICD-10-CM | POA: Diagnosis not present

## 2024-05-25 DIAGNOSIS — R5383 Other fatigue: Secondary | ICD-10-CM | POA: Diagnosis not present

## 2024-05-25 NOTE — Patient Instructions (Signed)
  VISIT SUMMARY: Today, we discussed your history of pleural effusion and the recent thoracentesis you underwent. You reported feeling better after the procedure, with only a slight sensation of discomfort remaining. We reviewed your past medical history, current medications, and lifestyle factors.  YOUR PLAN: -HISTORY OF LEFT PLEURAL EFFUSION, RESOLVED: A pleural effusion is a buildup of fluid between the tissues that line the lungs and the chest. Your recent thoracentesis procedure successfully drained the fluid, and you are currently not experiencing any significant symptoms. We will order a chest x-ray to confirm that the fluid has not returned. If the x-ray is normal, no further action is needed. However, if the fluid recurs, we will schedule a follow-up appointment for further evaluation.  INSTRUCTIONS: If the fluid recurs, please schedule a follow-up appointment for further evaluation.

## 2024-05-25 NOTE — Progress Notes (Signed)
 Subjective:   PATIENT ID: Adam Giles Adam Giles: male DOB: Dec 10, 1945, MRN: [id number]   HPI Discussed the use of AI scribe software for clinical note transcription with the patient, who gave verbal consent to proceed.  History of Present Illness Adam Giles is an 78 year old male who presents with a history of pleural effusion and recent thoracentesis.  His symptoms began around May 1st with pleuritic pain, which was not localized and occurred in various areas including his back. He also experienced fatigue, dizziness, and balance issues. Initially, he suspected a recurrence of Lyme disease, which he had three years ago, but this was not confirmed.  He saw his PCP who found no immediate cause for his symptoms. An x-ray revealed a left pleural effusion, leading to an ultrasound and subsequent thoracentesis in June, where 1.5 liters of fluid were drained. Post-procedure, he felt better after a few days. He mentions a slight sensation of discomfort in the same area, but denies any pain, weakness, or other symptoms.   His past medical history includes Lyme disease, and he takes medication for blood pressure and cholesterol, both of which are well-controlled. He has a history of smoking and occupational exposure to fiberglass, primer dust, and paint fumes from working in an [employer].  No weight loss, loss of appetite, fevers, chills, night sweats, cough, or sputum production. He has not traveled outside the country recently and does not believe he has been exposed to tuberculosis. He has no history of pancreatitis or cancer, and his gallbladder has not caused any past issues. He is a caregiver for his wife, who has multiple health issues, and he leads a relatively inactive lifestyle.     History reviewed. No pertinent past medical history.   History reviewed. No pertinent family history.   Social History   Socioeconomic History   Marital status: Married    Spouse name: Not  on file   Number of children: Not on file   Years of education: Not on file   Highest education level: Not on file  Occupational History   Not on file  Tobacco Use   Smoking status: Former    Types: Cigarettes   Smokeless tobacco: Not on file   Tobacco comments:    Patient stopped in 2006 mp   Substance and Sexual Activity   Alcohol use: Not on file   Drug use: Not on file   Sexual activity: Not on file  Other Topics Concern   Not on file  Social History Narrative   Not on file   Social Drivers of Health   Financial Resource Strain: Not on file  Food Insecurity: Not on file  Transportation Needs: Not on file  Physical Activity: Not on file  Stress: Not on file  Social Connections: Not on file  Intimate Partner Violence: Not on file     Not on File   Outpatient Medications Prior to Visit  Medication Sig Dispense Refill   aspirin EC 81 MG tablet 1 tablet Orally 3 times a day     lisinopril (ZESTRIL) 10 MG tablet Take 10 mg by mouth daily.     Omega-3 Fatty Acids (FISH OIL) 1200 MG CAPS 1 capsule.     rosuvastatin (CRESTOR) 5 MG tablet Take 5 mg by mouth daily.     vitamin E 45 MG (100 UNITS) capsule 1 capsule.     No facility-administered medications prior to visit.    ROS Reviewed all systems and reported  negative except as above     Objective:   Vitals:   05/25/24 1100  BP: 125/71  Pulse: 63  Temp: 97.8 F (36.6 C)  TempSrc: Oral  SpO2: 98%  Weight: 210 lb 12.8 oz (95.6 kg)  Height: 5' 10 (1.778 m)    Physical Exam Constitutional:      Appearance: Normal appearance.  HENT:     Nose: Nose normal.     Mouth/Throat:     Mouth: Mucous membranes are moist.  Eyes:     Extraocular Movements: Extraocular movements intact.     Pupils: Pupils are equal, round, and reactive to light.  Cardiovascular:     Rate and Rhythm: Normal rate.  Pulmonary:     Effort: Pulmonary effort is normal.  Abdominal:     General: Abdomen is flat.  Skin:    General:  Skin is warm.     Capillary Refill: Capillary refill takes less than 2 seconds.  Neurological:     General: No focal deficit present.     Mental Status: He is alert.        CBC No results found for: WBC, RBC, HGB, HCT, PLT, MCV, MCH, MCHC, RDW, LYMPHSABS, MONOABS, EOSABS, BASOSABS   Chest imaging: CT chest June 2025 Left pleural effusion, no masses or consolidation concerning for infection or malginancy  I performed a bedside US  today which showed no evidence of a pleural effusion on the left       Assessment & Plan:    Assessment & Plan History of left pleural effusion, resolved Left pleural effusion that persisted post thoracentesis seen on MRI in July. No current symptoms. Ultrasound performed today confirmed resolution of effusion, - Order chest x-ray to confirm absence of fluid. - If x-ray is normal, no further action is needed. - If fluid recurs, schedule follow-up appointment for further evaluation.  Review of fluid showed exudative fluid with lymphocyte predominance, this can be seen in the setting of infection or malignancy, cytology and cultures reassuring. If fluid recurs would consider thoracentesis followed by repeat CT. Fortunately I don't see any fluid on his bedside US  and I think this issue has resolved.          Zola Herter, MD [facility] Office: [phone]
